# Patient Record
Sex: Male | Born: 1935 | Race: Black or African American | Hispanic: No | Marital: Single
Health system: Southern US, Community
[De-identification: ages and names within clinical notes are randomized; demographics above are authoritative.]

## PROBLEM LIST (undated history)

## (undated) DIAGNOSIS — F79 Unspecified intellectual disabilities: Secondary | ICD-10-CM

## (undated) DIAGNOSIS — I10 Essential (primary) hypertension: Secondary | ICD-10-CM

## (undated) DIAGNOSIS — E785 Hyperlipidemia, unspecified: Secondary | ICD-10-CM

---

## 1997-11-22 ENCOUNTER — Other Ambulatory Visit: Admission: RE | Admit: 1997-11-22 | Discharge: 1997-11-22 | Payer: Self-pay | Admitting: Nephrology

## 1998-01-25 ENCOUNTER — Other Ambulatory Visit: Admission: RE | Admit: 1998-01-25 | Discharge: 1998-01-25 | Payer: Self-pay | Admitting: Nephrology

## 1998-07-11 ENCOUNTER — Encounter: Payer: Self-pay | Admitting: Nephrology

## 1998-07-11 ENCOUNTER — Ambulatory Visit (HOSPITAL_COMMUNITY): Admission: RE | Admit: 1998-07-11 | Discharge: 1998-07-11 | Payer: Self-pay | Admitting: Nephrology

## 1998-08-02 ENCOUNTER — Ambulatory Visit (HOSPITAL_COMMUNITY): Admission: RE | Admit: 1998-08-02 | Discharge: 1998-08-02 | Payer: Self-pay | Admitting: Nephrology

## 2000-03-20 ENCOUNTER — Encounter: Admission: RE | Admit: 2000-03-20 | Discharge: 2000-03-20 | Payer: Self-pay | Admitting: Nephrology

## 2000-03-20 ENCOUNTER — Encounter: Payer: Self-pay | Admitting: Nephrology

## 2000-06-12 ENCOUNTER — Ambulatory Visit (HOSPITAL_COMMUNITY): Admission: RE | Admit: 2000-06-12 | Discharge: 2000-06-12 | Payer: Self-pay | Admitting: Urology

## 2000-06-12 ENCOUNTER — Encounter (INDEPENDENT_AMBULATORY_CARE_PROVIDER_SITE_OTHER): Payer: Self-pay | Admitting: Specialist

## 2001-08-31 ENCOUNTER — Encounter: Payer: Self-pay | Admitting: Nephrology

## 2001-08-31 ENCOUNTER — Ambulatory Visit (HOSPITAL_COMMUNITY): Admission: RE | Admit: 2001-08-31 | Discharge: 2001-08-31 | Payer: Self-pay | Admitting: Nephrology

## 2003-05-02 ENCOUNTER — Encounter (INDEPENDENT_AMBULATORY_CARE_PROVIDER_SITE_OTHER): Payer: Self-pay | Admitting: Specialist

## 2003-05-02 ENCOUNTER — Ambulatory Visit (HOSPITAL_COMMUNITY): Admission: RE | Admit: 2003-05-02 | Discharge: 2003-05-02 | Payer: Self-pay | Admitting: Gastroenterology

## 2003-05-16 ENCOUNTER — Encounter: Payer: Self-pay | Admitting: Nephrology

## 2003-05-16 ENCOUNTER — Encounter: Admission: RE | Admit: 2003-05-16 | Discharge: 2003-05-16 | Payer: Self-pay | Admitting: Nephrology

## 2005-11-26 ENCOUNTER — Ambulatory Visit (HOSPITAL_COMMUNITY): Admission: RE | Admit: 2005-11-26 | Discharge: 2005-11-26 | Payer: Self-pay | Admitting: Nephrology

## 2006-02-06 ENCOUNTER — Inpatient Hospital Stay (HOSPITAL_COMMUNITY): Admission: EM | Admit: 2006-02-06 | Discharge: 2006-02-22 | Payer: Self-pay | Admitting: *Deleted

## 2006-02-10 ENCOUNTER — Encounter (INDEPENDENT_AMBULATORY_CARE_PROVIDER_SITE_OTHER): Payer: Self-pay | Admitting: Cardiology

## 2006-03-03 ENCOUNTER — Encounter: Admission: RE | Admit: 2006-03-03 | Discharge: 2006-03-03 | Payer: Self-pay | Admitting: Nephrology

## 2006-05-06 ENCOUNTER — Encounter: Admission: RE | Admit: 2006-05-06 | Discharge: 2006-05-06 | Payer: Self-pay | Admitting: Nephrology

## 2006-05-30 ENCOUNTER — Encounter: Admission: RE | Admit: 2006-05-30 | Discharge: 2006-05-30 | Payer: Self-pay | Admitting: Nephrology

## 2006-05-30 ENCOUNTER — Inpatient Hospital Stay (HOSPITAL_COMMUNITY): Admission: EM | Admit: 2006-05-30 | Discharge: 2006-06-11 | Payer: Self-pay | Admitting: Emergency Medicine

## 2009-05-12 ENCOUNTER — Encounter: Admission: RE | Admit: 2009-05-12 | Discharge: 2009-05-12 | Payer: Self-pay | Admitting: Urology

## 2009-05-16 ENCOUNTER — Ambulatory Visit (HOSPITAL_BASED_OUTPATIENT_CLINIC_OR_DEPARTMENT_OTHER): Admission: RE | Admit: 2009-05-16 | Discharge: 2009-05-16 | Payer: Self-pay | Admitting: Urology

## 2009-05-16 ENCOUNTER — Encounter (INDEPENDENT_AMBULATORY_CARE_PROVIDER_SITE_OTHER): Payer: Self-pay | Admitting: Urology

## 2009-06-07 ENCOUNTER — Ambulatory Visit (HOSPITAL_COMMUNITY): Admission: RE | Admit: 2009-06-07 | Discharge: 2009-06-07 | Payer: Self-pay | Admitting: Urology

## 2009-06-30 ENCOUNTER — Ambulatory Visit: Admission: RE | Admit: 2009-06-30 | Discharge: 2009-08-02 | Payer: Self-pay | Admitting: Radiation Oncology

## 2009-07-05 ENCOUNTER — Ambulatory Visit (HOSPITAL_COMMUNITY): Admission: RE | Admit: 2009-07-05 | Discharge: 2009-07-05 | Payer: Self-pay | Admitting: Gastroenterology

## 2010-11-23 LAB — POCT I-STAT 4, (NA,K, GLUC, HGB,HCT)
HCT: 37 % — ABNORMAL LOW (ref 39.0–52.0)
Hemoglobin: 12.6 g/dL — ABNORMAL LOW (ref 13.0–17.0)
Sodium: 140 mEq/L (ref 135–145)

## 2011-01-04 NOTE — Discharge Summary (Signed)
[name], [name]                  ACCOUNT NO.:  [id number]   MEDICAL RECORD NO.:  [id number]          PATIENT TYPE:  INP   LOCATION:  6706                         FACILITY:  [facility]   PHYSICIAN:  Jarome Matin, M.D.DATE OF BIRTH:  05/12/1931   DATE OF ADMISSION:  05/30/2006  DATE OF DISCHARGE:  06/11/2006                               DISCHARGE SUMMARY   ADMITTING DIAGNOSES:  1. Congestive heart failure.  2. Pleural effusion.  3. Endocarditis.  4. Ventricular tachycardia.  5. Urinary tract infection.  6. Mental retardation.  7. Hypertension.  8. Chronic obstructive pulmonary disease.  9. Tobacco abuse.  10.Open wound and scab.  11.Cardiomyopathy.  12.Anemia.   BRIEF HISTORY AND PHYSICAL AND HOSPITAL COURSE:  This is a 77 year old  African American male who has mental retardation and is cared for and  lives with his sister, who is his caregiver.  While going to get a chest  x-ray on the morning of admission, he fell and hit his head, was knocked  unconscious; they did a chest x-ray and sent him to the emergency room.  CT of his head showed no acute changes.  He had chronic changes but no  evidence of bleeding.  The patient was in congestive failure and his BNP  was over 3,000.  He had bilateral basal effusions.  He has chronic  congestive heart failure.   PHYSICAL EXAMINATION:  VITAL SIGNS:  Blood pressure 124/80, pulse of 94,  respirations 20.  LUNGS:  Bilateral bronchial rales.  ABDOMEN:  Active bowel sounds.  Soft abdomen.  No mass.  No  organomegaly.   The patient was admitted to telemetry.  Dr. Sharyn Lull saw the patient for  cardiology.  He had some pleural effusions and cough.  Inadvertently, he  pulled out his Foley and had bleeding and swelling of his penis, and  urine had nitrite positive.  It looks like he had a urinary tract  infection.  His PSA was elevated at 10.08, good possibility for prostate  cancer.  Uric acid was elevated to 7.5.  Creatinine 1.2 with  a BUN of  25.  Cardiac markers showed no indication for an acute MI.  Hemoglobin  was 12.4 on admission and gradually came down to 10.7.  His pleural  effusions gradually decreased.  His EKG showed sinus rhythm with  occasional PVC, old anterior infarct.  Once, we got the Foley back in  the patient had good urine flow.  Gradually as we diuresed him, got him  out of failure.  His BNP gradually came down to 904.  We continued to  try to diurese the patient, to get as much fluid to get him out of  chronic congestive  failure.  When he came in, his BNP was greater than 3200.  It came down  to 576 __________ and discharged him on his present meds with his  sister.  The patient __________ and is stable.  The patient was  discharged.  He will be seen in the office in two weeks.  Discharged on  his present medications.  ______________________________  Jarome Matin, M.D.     CEF/MEDQ  D:  08/06/2006  T:  08/06/2006  Job:  [id number]

## 2011-01-04 NOTE — Op Note (Signed)
[place]  Patient:    [name], [name]                         MRN: [id number] Proc. Date: 06/12/00 Adm. Date:  [id number] Attending:  Lindaann Slough                           Operative Report  PREOPERATIVE DIAGNOSIS:  Benign prostatic hypertrophy, rule out carcinoma of prostate.  POSTOPERATIVE DIAGNOSIS:  Benign prostatic hypertrophy, rule out carcinoma of prostate.  OPERATION PERFORMED:  Cystoscopy and transperineal needle biopsy of prostate.  SURGEON:  Lindaann Slough, M.D.  ANESTHESIA:  General.  INDICATIONS FOR PROCEDURE:  The patient is a 77 year old male who had an elevated PSA.  He was scheduled for an ultrasound biopsy of the prostate in the office but he could not tolerate the procedure so he is scheduled for biopsy of the prostate under anesthesia.  DESCRIPTION OF PROCEDURE:  Under general anesthesia, the patient was prepped and draped and placed in dorsal lithotomy position.  A #23 Wappler cystoscope was inserted in the bladder.  The patient has trilobar prostatic hypertrophy. The bladder mucosa was normal.  There was no stone or tumor in the bladder. The ureteral orifices were in normal position and shape with clear efflux. The cystoscope was then removed.  With a Tru-Cut needle a transperineal needle biopsy of both lobes of the prostate was done.  The cystoscope was then reinserted in the bladder and some small blood clots were irrigated out of the bladder.  The bladder was then emptied and the cystoscope removed.  The patient tolerated the procedure well and left the operating room in satisfactory condition to post anesthesia care unit. DD:  06/12/00 TD:  06/12/00 Job: [id number] [id number]

## 2011-01-04 NOTE — Op Note (Signed)
NAME:  [name], [name]                            ACCOUNT NO.:  [id number]   MEDICAL RECORD NO.:  [id number]                   PATIENT TYPE:  AMB   LOCATION:  ENDO                                 FACILITY:  [facility]   PHYSICIAN:  Anselmo Rod, M.D.               DATE OF BIRTH:  11-15-1930   DATE OF PROCEDURE:  05/02/2003  DATE OF DISCHARGE:                                 OPERATIVE REPORT   PROCEDURE PERFORMED:  Colonoscopy with snare polypectomy times three and  cold biopsy times four.   ENDOSCOPIST:  Charna Elizabeth, M.D.   INSTRUMENT USED:  Olympus video colonoscope.   INDICATIONS FOR PROCEDURE:  The patient is a 77 year old African-American  male with a history of rectal bleeding and family history of colon cancer in  a maternal aunt.  Rule out colonic polyps, masses, etc.   PREPROCEDURE PREPARATION:  Informed consent was procured from the patient.  The patient was fasted for eight hours prior to the procedure and prepped  with a bottle of magnesium citrate and a gallon of GoLYTELY the night prior  to the procedure.   PREPROCEDURE PHYSICAL:  The patient had stable vital signs.  Neck supple.  Chest clear to auscultation.  S1 and S2 regular.  Abdomen soft with normal  bowel sounds.   DESCRIPTION OF PROCEDURE:  The patient was placed in left lateral decubitus  position and sedated with 40 mg of Demerol and 4 mg of Versed intravenously.  Once the patient was adequately sedated and maintained on low flow oxygen  and continuous cardiac monitoring, the Olympus video colonoscope was  advanced from the rectum to the cecum without difficulty.  The patient had a  fairly good prep.  Three small sessile polyps were seen in the right colon.  Two of them were in the cecum.  The polyp from the cecum was removed by a  cold snare.  The polyp from the proximal right colon was removed by a snare  polypectomy with application of cautery.  Two small sessile polyps were  biopsied, one from 70 cm and  one from the rectum.  There was no evidence of  diverticulosis.  Retroflexion in the rectum revealed small nonbleeding  internal hemorrhoids.  The patient tolerated the procedure well without  complications.   IMPRESSION:  1. Multiple colonic polyps removed (see description above).  2. Small nonbleeding internal hemorrhoids.   RECOMMENDATIONS:  1. Await pathology results.  2. Avoid all nonsteroidals including aspirin for now.  3. Outpatient follow-up in the next two weeks for further recommendations.                                                   Anselmo Rod, M.D.    JNM/MEDQ  D:  05/02/2003  T:  05/02/2003  Job:  [id number]   cc:   Jarome Matin, M.D.  [address] [place]  [place] [address]  Fax: [phone]

## 2011-01-04 NOTE — Op Note (Signed)
[name], [name]                  ACCOUNT NO.:  [id number]   MEDICAL RECORD NO.:  [id number]          PATIENT TYPE:  INP   LOCATION:  6706                         FACILITY:  [facility]   PHYSICIAN:  Todd Purpura, MD      DATE OF BIRTH:  [birth year]   DATE OF PROCEDURE:  05/31/2006  DATE OF DISCHARGE:                                 OPERATIVE REPORT   PREOPERATIVE DIAGNOSIS:  Difficult catheterization.   POSTOPERATIVE DIAGNOSIS:  Difficult catheterization.   PROCEDURES:  1. Flexible cystoscopy.  2. Placement of catheter under cystoscopic guidance.   SURGEON:  Crecencio Mc, M.D.   ANESTHESIA:  Intravenous sedation with mask anesthetic.   INDICATIONS:  [name] is a 77 year old gentleman, who was admitted to the  hospital with congestive heart failure, as well as recent head trauma.  He  pulled his Foley catheter earlier this morning.  And attempts, both by the  nursing staff as well as myself, to place a catheter at the bedside were  unsuccessful.  In addition, the patient was unable to tolerate any further  procedure at the bedside.  Therefore, it was felt in his best interest to  proceed with flexible cystoscopy and placement of a catheter over a guide  wire under sedation in the operating room.  Potential risks and benefits  were discussed with the patient, as well as his sister, who is his  caregiver.  Consent was obtained.   DESCRIPTION OF PROCEDURE:  The patient was taken to the operating room and  intravenous sedation and a mask anesthetic were administered.  The patient's  genitalia were prepped and draped in the usual sterile fashion.  Flexible  cystourethroscopy was then performed.  This did demonstrate some bleeding  within the urethra.  However, the cystoscope was able to be navigated easily  into the bladder.  There was trilobar hypertrophy of the prostate.  A 0.038  sensor guide wire was then advanced through the scope and into the bladder.  The scope was then removed and  a 16-French Councill tip catheter was passed  easily over the wire and into the bladder.  The wire was then removed and  the catheter balloon was inflated with 20 cc of sterile water.  Approximately 400 cc of grossly clear urine was returned.  The catheter was  then placed to straight drainage and the procedure was ended.  The patient  tolerated the procedure well and without complications.           ______________________________  Todd Purpura, MD  Electronically Signed     LB/MEDQ  D:  05/31/2006  T:  06/01/2006  Job:  [id number]

## 2011-01-04 NOTE — Discharge Summary (Signed)
[name], [name]                  ACCOUNT NO.:  [id number]   MEDICAL RECORD NO.:  [id number]           PATIENT TYPE:   LOCATION:                                 FACILITY:   PHYSICIAN:  Jarome Matin, M.D.    DATE OF BIRTH:   DATE OF ADMISSION:  02/06/2006  DATE OF DISCHARGE:  02/22/2006                               DISCHARGE SUMMARY   ADMISSION DIAGNOSIS:  Pneumonia.   DISCHARGE DIAGNOSES:  1. Simple pneumonia with pleurisy.  2. Chronic obstructive pulmonary disease.  3. Pleural effusion.  4. Reflux esophagitis.  5. Benign prostatic hypertrophy.  6. Mental retardation.  7. Elevated prostate specific antigen.  8. Tobacco abuse disorder.   HISTORY AND PHYSICAL AND HOSPITAL COURSE:  This is one of severe [facility] admissions for this 77 year old African-American male who has  mental retardation.  He was brought to the emergency room by his sister  because of fevers.  He had a productive cough of green sputum for 4-6  weeks.  Poor historian.  No history of loss of consciousness or seizure  disorder.   PHYSICAL EXAMINATION:  VITAL SIGNS:  Blood pressure 124/82, heart rate  84, respirations 20, temperature 98.7.  NECK:  Supple.  LUNGS: Scattered rhonchi bilaterally with some rales at the right base,  no wheezes.  CARDIAC:  Regular rhythm, no gallops or rubs.  ABDOMEN: Soft.  No organomegaly.  EXTREMITIES: No clubbing.  MUSCULOSKELETAL:  No CVA or back tenderness.  NEUROLOGIC:  The patient was alert but nonverbal.   Chest x-ray: Cardiomegaly with some infiltrates in the right lower lobe,  possible effusion.   CT of the brain: No acute abnormalities.   White count greater than 7000,  hemoglobin 12, hematocrit 35.  Sodium  139, potassium 4.3.   The patient was admitted, put to bed, and started on no-added-salt diet,  no concentrated sweets.  Normal saline at 120 mL an hour.  Rocephin 1 g  daily IV and Zithromax 500 mg IV for 5 days.  Humibid LA 1 b.i.d.,  Rozerem  8 mg nightly, Tussionex 5 mg b.i.d. as needed for cough,  albuterol 2.5 plus Atrovent 0.5 nebulizers 4 times a day.  Deep vein  thrombosis prophylaxis with Lovenox 40 mg daily.  Lasix 40 mg p.o. daily  and started Lanoxin 0.25 daily, Coreg 3.125 p.o. b.i.d.   Other pertinent labs were PSA 6.52.  B-natruretic peptide initially was  2128.  Gradually as we treated his congestive heart failure, it dropped  down to 463 at time of discharge.  Sodium 139, glucose 110.  Calcium was  7.7.  The pCO2 was 29.2.  His right lower lobe probable pneumonia  actually worsened while he was in the hospital.  He had some pulmonary  vascular congestion that gradually got better.   EKG showed sinus rhythm with some runs of atrial fibrillation, old  septal infarct.   He had an echocardiogram which showed some marked dilated left  ventricle.  His left ventricular systolic function was severely reduced  with ejection fraction in the range of 15-20%, diffuse  left ventricular  hypertrophy, moderate free flowing ectopy, pericardial effusion  posterior to the heart, moderate right atrial chamber collapse.   We treated the patient for pneumonia.  We also treated him for  congestive heart failure.  His cough improved.  Chest x-rays seemed a  bit worse.  Congestive heart failure was some worse.  Gradually as we  treated the patient, his respirations improved.  BNP gradually dropped.  We increased the Lasix to 40 mg a day p.o.  As the Lasix got rid of the  fluid in his lungs, his BNP improved. He gradually reached the point  where we discharged him home on:  1. Lasix 40 mg a day.  2. Coreg 3.125 mg twice a day.  3. BiDil 1 p.o. twice a day.  4. Lanoxin 0.125 daily.  5. We kept him on Avelox 400 mg.  We stopped Zithromax and Rocephin.   He is scheduled to come back to see [place] in the office in 2 weeks, and his  sister was told to bring all of his medicines with him when he came  back.            ______________________________  Jarome Matin, M.D.     CEF/MEDQ  D:  10/06/2006  T:  10/06/2006  Job:  [id number]

## 2011-01-04 NOTE — Consult Note (Signed)
NAMEHAGOP, MCCOLLAM                  ACCOUNT NO.:  [id number]   MEDICAL RECORD NO.:  [id number]          PATIENT TYPE:  INP   LOCATION:  6706                         FACILITY:  [facility]   PHYSICIAN:  Heloise Purpura, MD      DATE OF BIRTH:  [birth year]   DATE OF CONSULTATION:  05/31/2006  DATE OF DISCHARGE:                                   CONSULTATION   REASON FOR CONSULTATION:  Difficult Foley catheterization.   HISTORY:  Mr. Scheffel is a 77 year old gentleman who is admitted to the hospital  with congestive heart failure as well as head trauma.  He did have a Foley  catheter in place for monitoring his urine output.  The patient became  confused this morning and did pull his Foley catheter out.  An attempt by  the nursing staff to place a Foley catheter was unsuccessful and therefore  urologic consultation was obtained.   PHYSICAL EXAMINATION:  GU:  The patient had significant lower extremity and  genital edema.  The foreskin was able to be retracted to identify the  urethral meatus.  An attempt was made to pass a 16-French coude catheter at  the bedside.  This was unsuccessful and the patient was unable to tolerate  this procedure.   IMPRESSION:  Difficult Foley catheterization.   PLAN:  Due to the patient's current inability to tolerate a bedside  procedure and the difficulty in placing the catheterization, I  believe it  is in the patient's best interest to proceed with flexible cystoscopy and  placement of catheter under cystoscopic guidance over a guidewire under  intravenous sedation in the operating room.  The risks and benefits were  discussed with the patient as well as his caregiver who is his sister.  Informed consent was obtained and we will proceed in this fashion.  I would  then recommend that the Foley catheter be left in place at this time due to  the recent Foley catheter trauma.           ______________________________  Heloise Purpura, MD  Electronically  Signed     LB/MEDQ  D:  05/31/2006  T:  06/01/2006  Job:  [id number]

## 2011-09-21 ENCOUNTER — Other Ambulatory Visit: Payer: Self-pay | Admitting: Cardiology

## 2011-09-27 ENCOUNTER — Other Ambulatory Visit: Payer: Self-pay | Admitting: Cardiology

## 2011-11-24 ENCOUNTER — Other Ambulatory Visit: Payer: Self-pay | Admitting: Cardiology

## 2011-12-05 ENCOUNTER — Ambulatory Visit
Admission: RE | Admit: 2011-12-05 | Discharge: 2011-12-05 | Disposition: A | Discharge status: Home / Self Care | Payer: Medicare Other | Source: Ambulatory Visit | Attending: Nephrology | Admitting: Nephrology

## 2011-12-05 ENCOUNTER — Other Ambulatory Visit: Payer: Self-pay | Admitting: Nephrology

## 2011-12-05 DIAGNOSIS — Z8679 Personal history of other diseases of the circulatory system: Secondary | ICD-10-CM

## 2012-01-07 ENCOUNTER — Other Ambulatory Visit: Payer: Self-pay | Admitting: Cardiology

## 2012-02-04 ENCOUNTER — Other Ambulatory Visit: Payer: Self-pay | Admitting: Nephrology

## 2012-02-29 ENCOUNTER — Other Ambulatory Visit: Payer: Self-pay | Admitting: Cardiology

## 2012-03-05 ENCOUNTER — Other Ambulatory Visit: Payer: Self-pay | Admitting: Cardiology

## 2012-04-02 ENCOUNTER — Other Ambulatory Visit: Payer: Self-pay | Admitting: Cardiology

## 2012-05-23 IMAGING — CR DG CHEST 2V
2 series · 2 of 2 positions shown · non-contrast
Comparison: Chest x-ray of 05/12/2009

CLINICAL DATA: History of congestive heart failure, some shortness
of breath

CHEST - 2 VIEW

[w chest lat]
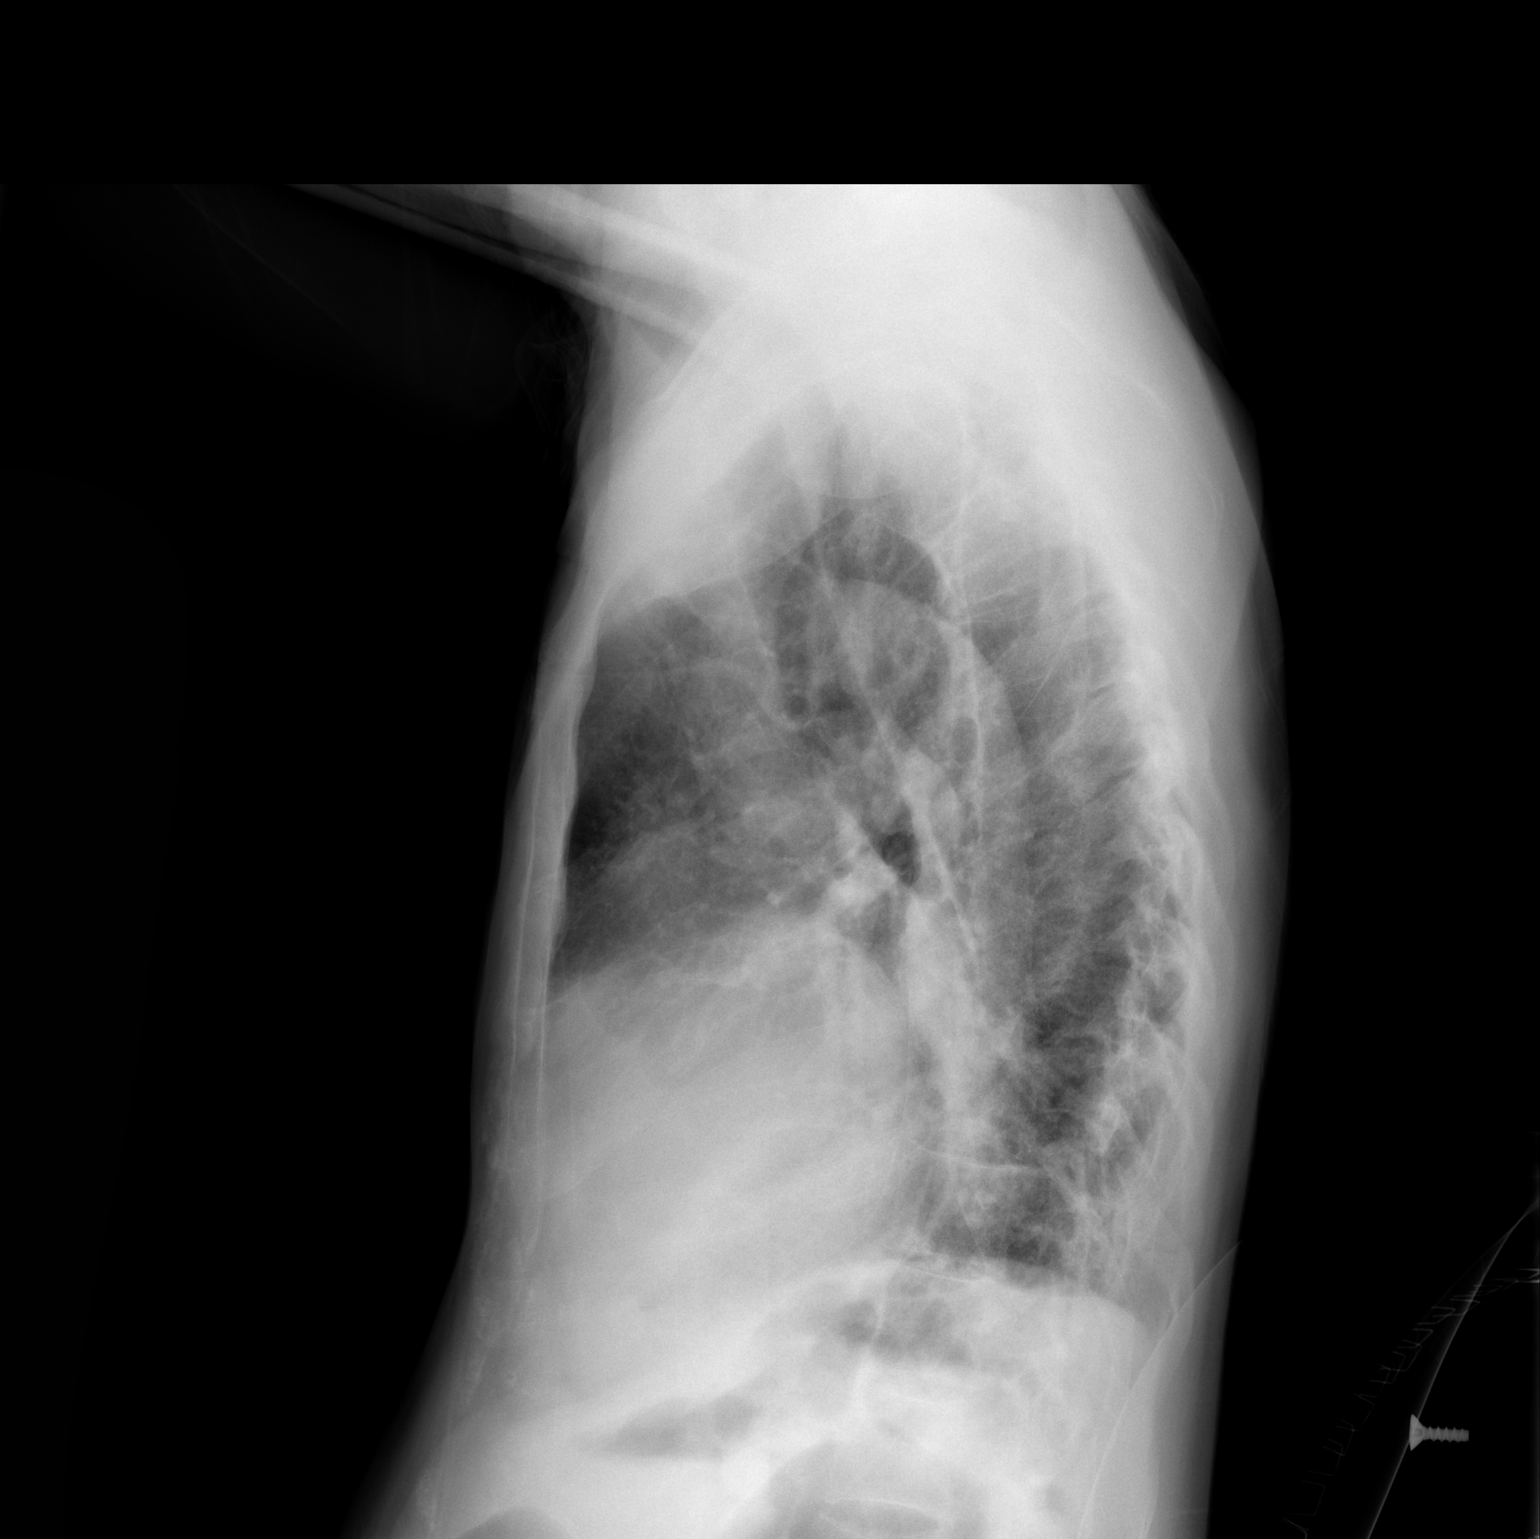

[w chest ap]
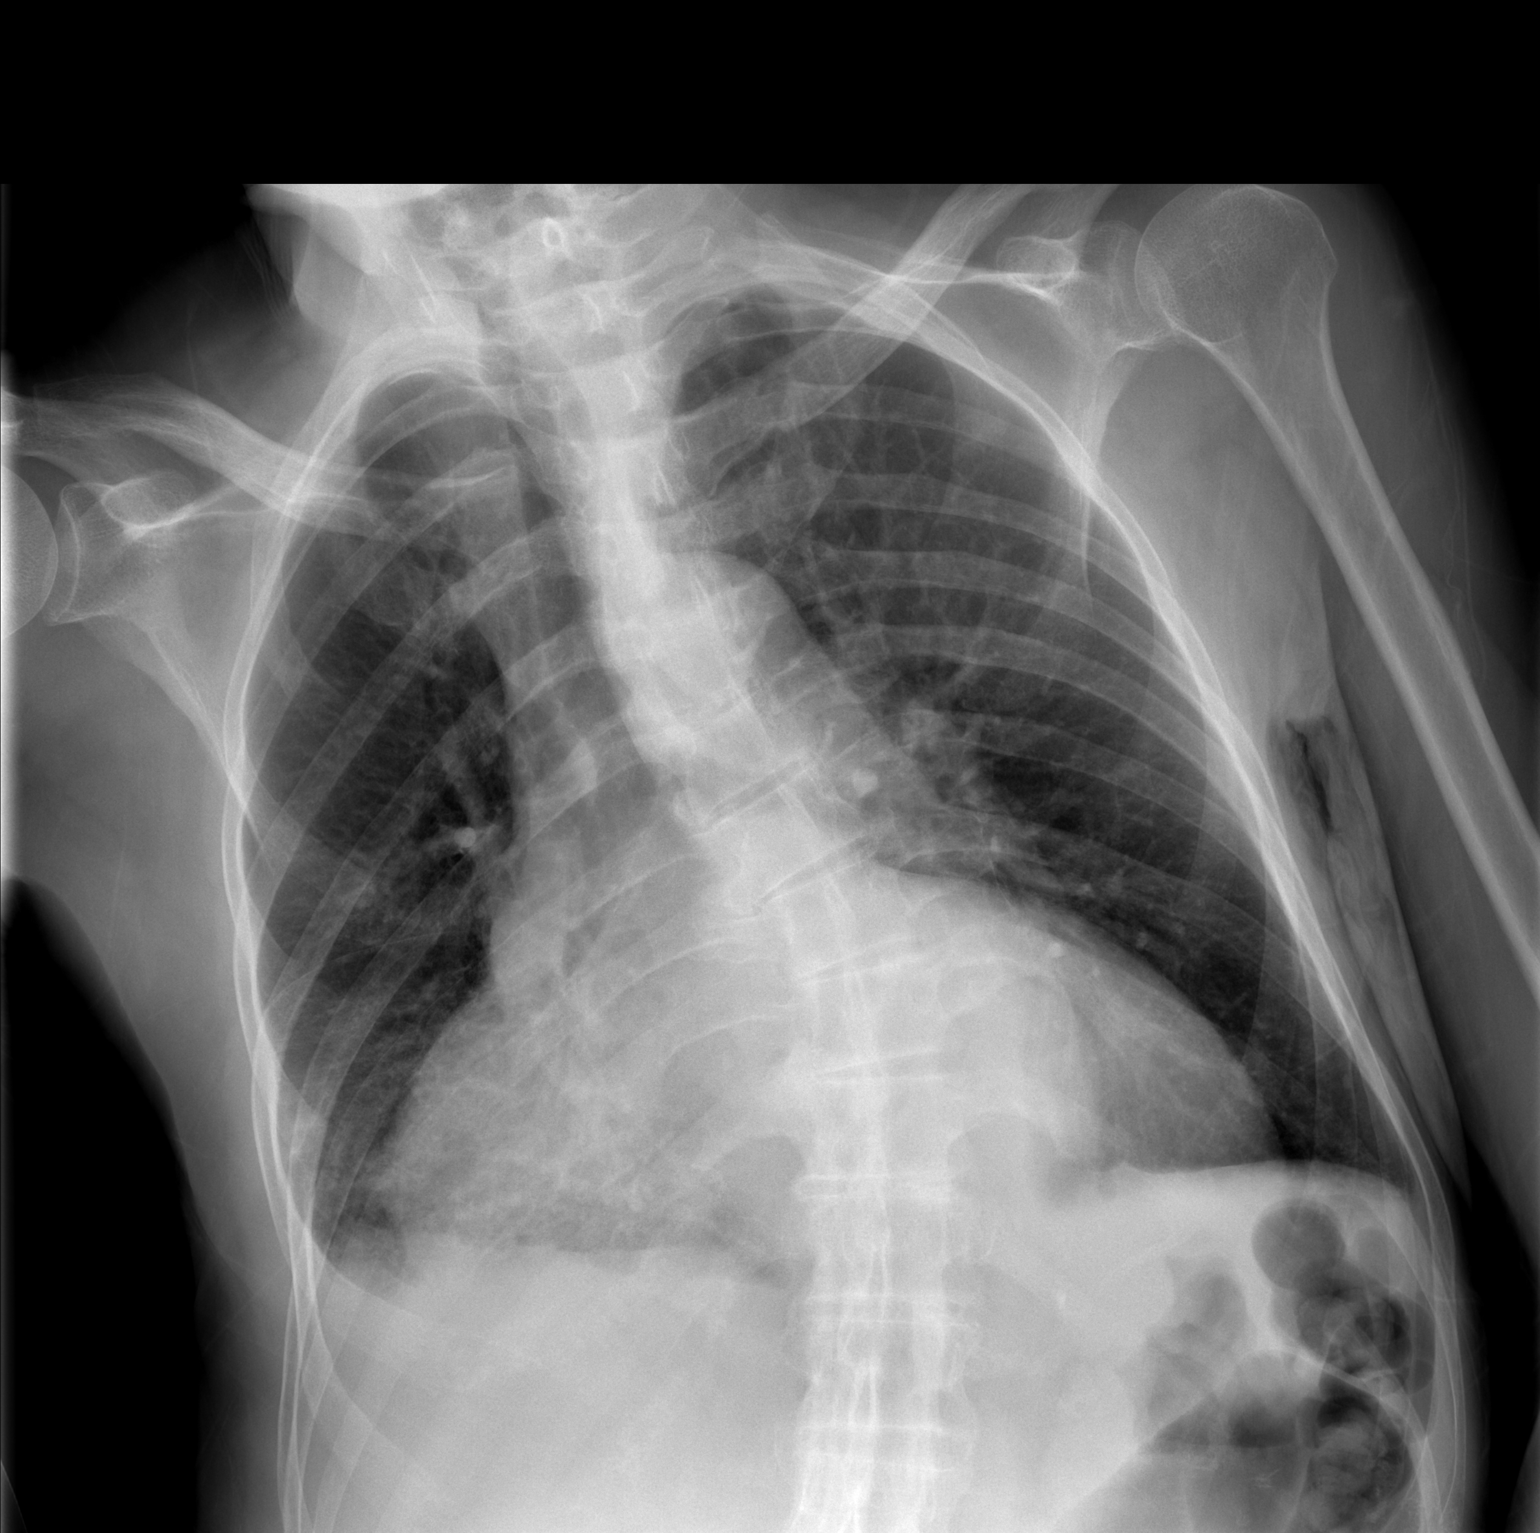

[2 of 2 positions shown; findings below may reference images not displayed]

FINDINGS: Moderate cardiomegaly appears relatively stable.  There
may be a small right pleural effusion present.  No focal infiltrate
is seen.  There is a thoracic scoliosis present convex to the left.
IMPRESSION: Stable degree of cardiomegaly.  Cannot exclude small right pleural
effusion.  No definite active process.  Scoliosis.

## 2016-09-24 ENCOUNTER — Emergency Department (HOSPITAL_COMMUNITY)
Admission: EM | Admit: 2016-09-24 | Discharge: 2016-10-17 | Disposition: E | Payer: Medicare Other | Attending: Emergency Medicine | Admitting: Emergency Medicine

## 2016-09-24 ENCOUNTER — Encounter (HOSPITAL_COMMUNITY): Payer: Self-pay

## 2016-09-24 DIAGNOSIS — Y999 Unspecified external cause status: Secondary | ICD-10-CM | POA: Insufficient documentation

## 2016-09-24 DIAGNOSIS — Y92002 Bathroom of unspecified non-institutional (private) residence single-family (private) house as the place of occurrence of the external cause: Secondary | ICD-10-CM | POA: Diagnosis not present

## 2016-09-24 DIAGNOSIS — I10 Essential (primary) hypertension: Secondary | ICD-10-CM | POA: Insufficient documentation

## 2016-09-24 DIAGNOSIS — I469 Cardiac arrest, cause unspecified: Secondary | ICD-10-CM | POA: Diagnosis present

## 2016-09-24 DIAGNOSIS — Y939 Activity, unspecified: Secondary | ICD-10-CM | POA: Insufficient documentation

## 2016-09-24 DIAGNOSIS — S01112A Laceration without foreign body of left eyelid and periocular area, initial encounter: Secondary | ICD-10-CM | POA: Diagnosis not present

## 2016-09-24 DIAGNOSIS — X58XXXA Exposure to other specified factors, initial encounter: Secondary | ICD-10-CM | POA: Insufficient documentation

## 2016-09-24 HISTORY — DX: Unspecified intellectual disabilities: F79

## 2016-09-24 HISTORY — DX: Hyperlipidemia, unspecified: E78.5

## 2016-09-24 HISTORY — DX: Essential (primary) hypertension: I10

## 2016-10-17 NOTE — Progress Notes (Signed)
PER EMS: pt from home. Family called EMS at 12:20; Pt found on bathroom floor, lac to left orbital area. EMS arrived and found unresponsive.   Escorted family to consult sub-waiting room.  EDP spoke with family. Patient deceased.   Family accepting and coping.  Provided emotional ,spiritual and grief support. Remained with family until their departure.   Jun 16, 2017 1400  Clinical Encounter Type  Visited With Patient;Family;Patient and family together;Health care provider  Visit Type Initial;Spiritual support;Death;ED;Trauma  Referral From Nurse  Spiritual Encounters  Spiritual Needs Emotional;Grief support  Stress Factors  Family Stress Factors Loss  Fae PippinWatlington, Bretta Fees, Chaplain, Pager [phone]

## 2016-10-17 NOTE — ED Notes (Signed)
See "code narrator" for code events and times.

## 2016-10-17 NOTE — ED Triage Notes (Signed)
PER EMS: pt from home. Family called EMS at 12:20; Pt found on bathroom floor, lac to left orbital area. EMS arrived and found to apneic, pulseless, and unresponsive. CPR started at 12:29. ROSC at 1300 and an EPI drip was started. Pt then started to vomit and went into vtach, shocked 200j, then went into PEA. CPR continued by EMS. Pt then went into vtach 2 minutes later with pulse check,shocked at 300j; went into PEA. Compressions started again. Pt then went into vfib, shocked at 360j, went into PEA again and then again into vfib and EMS shocked pt at 360j. Total of 7 epi's given, 2L NS infused, king airway. CPR continued on arrival to TRAUMA C.

## 2016-10-17 NOTE — ED Provider Notes (Signed)
[facility] Note   CSN: [id number] Arrival date & time: Sep 29, 2016  1335     History   Chief Complaint Chief Complaint  Patient presents with  . Cardiac Arrest    HPI [name] is a 83 y.o. male. Chief complaint is cardiac arrest.  HPI:  Patient presents from home. Family found patient responsive on the floor in the bathroom with laceration above his left eye. Upon arrival of EMS he was pulseless and apneic. Initial rhythm asystole. Vision given multiple doses of epinephrine. The [facility] airway placed. Lucas device placed for compressions. Had return of spontaneous circulation with A. fib. Epinephrine drip initiated. Patient had emesis. Had ventricular tachycardia. Shock with 200 J. Then again into pulseless O activity. Second episode of V. tach. Given second defibrillation. Arrives here undergoing compressions with wide complex disorganized rhythm on the monitor.  A complaint of pain or shortness of breath or other complaints per family  Past Medical History:  Diagnosis Date  . Hyperlipidemia   . Hypertension   . Mental retardation     There are no active problems to display for this patient.   History reviewed. No pertinent surgical history.     Home Medications    Prior to Admission medications   Not on File    Family History No family history on file.  Social History Social History  Substance Use Topics  . Smoking status: Unknown If Ever Smoked  . Smokeless tobacco: Never Used  . Alcohol use Not on file     Allergies   Patient has no allergy information on record.   Review of Systems Review of Systems  Unable to perform ROS: Acuity of condition     Physical Exam Updated Vital Signs BP (!) 0/0   Pulse (!) 0   Temp (!) 92.2 F (33.4 C) (Rectal)   Resp (!) 0   Ht 5\' 8"  (1.727 m)   Wt 100 lb (45.4 kg)   SpO2 (!) 0%   BMI 15.20 kg/m   Physical Exam  Constitutional:  Thin frail appearing  HENT:  Laceration above left  eyebrow. Pupils 3 mm nonreactive  Neck: No JVD present.  Cardiovascular:  Asystolic. No palpable pulses with compressions held  Pulmonary/Chest:  King airway in place. Symmetric breath sounds with bag assisted ventilations  Neurological:  Unresponsive GCS 3     ED Treatments / Results  Labs (all labs ordered are listed, but only abnormal results are displayed) Labs Reviewed - No data to display  EKG  EKG Interpretation None       Radiology No results found.  Procedures Procedures (including critical care time)  Medications Ordered in ED Medications - No data to display   Initial Impression / Assessment and Plan / ED Course  I have reviewed the triage vital signs and the nursing notes.  Pertinent labs & imaging results that were available during my care of the patient were reviewed by me and considered in my medical decision making (see chart for details).     Patient given additional IV epinephrine. King airway. Intubated without difficulty with ET tube. Continue bagging. Additional epinephrine. Had no return of spontaneous circulation and resuscitative efforts were terminated by myself and Dr. Hoyle Sauer 1 hours downtime. Please see flow sheet for specific times. Care discussed with medical examiner.  Final Clinical Impressions(s) / ED Diagnoses   Final diagnoses:  Cardiac arrest (HCC)    CRITICAL CARE Performed by: Rolland Porter JOSEPH   Total critical  care time: 30 minutes  Critical care time was exclusive of separately billable procedures and treating other patients.  Critical care was necessary to treat or prevent imminent or life-threatening deterioration.  Critical care was time spent personally by me on the following activities: development of treatment plan with patient and/or surrogate as well as nursing, discussions with consultants, evaluation of patient's response to treatment, examination of patient, obtaining history from patient or surrogate,  ordering and performing treatments and interventions, ordering and review of laboratory studies, ordering and review of radiographic studies, pulse oximetry and re-evaluation of patient's condition.  New Prescriptions There are no discharge medications for this patient.    Rolland PorterMark Diem Pagnotta, MD 09/28/16 [phone]

## 2016-10-17 DEATH — deceased
